# Patient Record
Sex: Male | Born: 1955 | Race: White | Hispanic: No | Marital: Single | Smoking: Never smoker
Health system: Southern US, Community
[De-identification: ages and names within clinical notes are randomized; demographics above are authoritative.]

## PROBLEM LIST (undated history)

## (undated) DIAGNOSIS — L509 Urticaria, unspecified: Secondary | ICD-10-CM

## (undated) HISTORY — PX: NO PAST SURGERIES: SHX2092

## (undated) HISTORY — DX: Urticaria, unspecified: L50.9

---

## 2000-03-11 ENCOUNTER — Encounter: Payer: Self-pay | Admitting: Family Medicine

## 2000-03-11 ENCOUNTER — Ambulatory Visit (HOSPITAL_COMMUNITY): Admission: RE | Admit: 2000-03-11 | Discharge: 2000-03-11 | Payer: Self-pay | Admitting: Family Medicine

## 2002-04-08 ENCOUNTER — Observation Stay (HOSPITAL_COMMUNITY): Admission: EM | Admit: 2002-04-08 | Discharge: 2002-04-09 | Payer: Self-pay | Admitting: Emergency Medicine

## 2002-04-08 ENCOUNTER — Encounter: Payer: Self-pay | Admitting: Emergency Medicine

## 2007-12-21 ENCOUNTER — Emergency Department (HOSPITAL_COMMUNITY): Admission: EM | Admit: 2007-12-21 | Discharge: 2007-12-21 | Payer: Self-pay | Admitting: Emergency Medicine

## 2007-12-29 ENCOUNTER — Ambulatory Visit (HOSPITAL_COMMUNITY): Admission: RE | Admit: 2007-12-29 | Discharge: 2007-12-29 | Payer: Self-pay | Admitting: Urology

## 2007-12-30 ENCOUNTER — Emergency Department (HOSPITAL_COMMUNITY): Admission: EM | Admit: 2007-12-30 | Discharge: 2007-12-30 | Payer: Self-pay | Admitting: Emergency Medicine

## 2009-05-26 IMAGING — CR DG ABDOMEN 1V
2 series · 2 of 2 positions shown · non-contrast
Comparison: None

CLINICAL DATA: Preop for right ureteral stone/right flank pain

ABDOMEN - 1 VIEW

[view not recorded (1 of 2)]
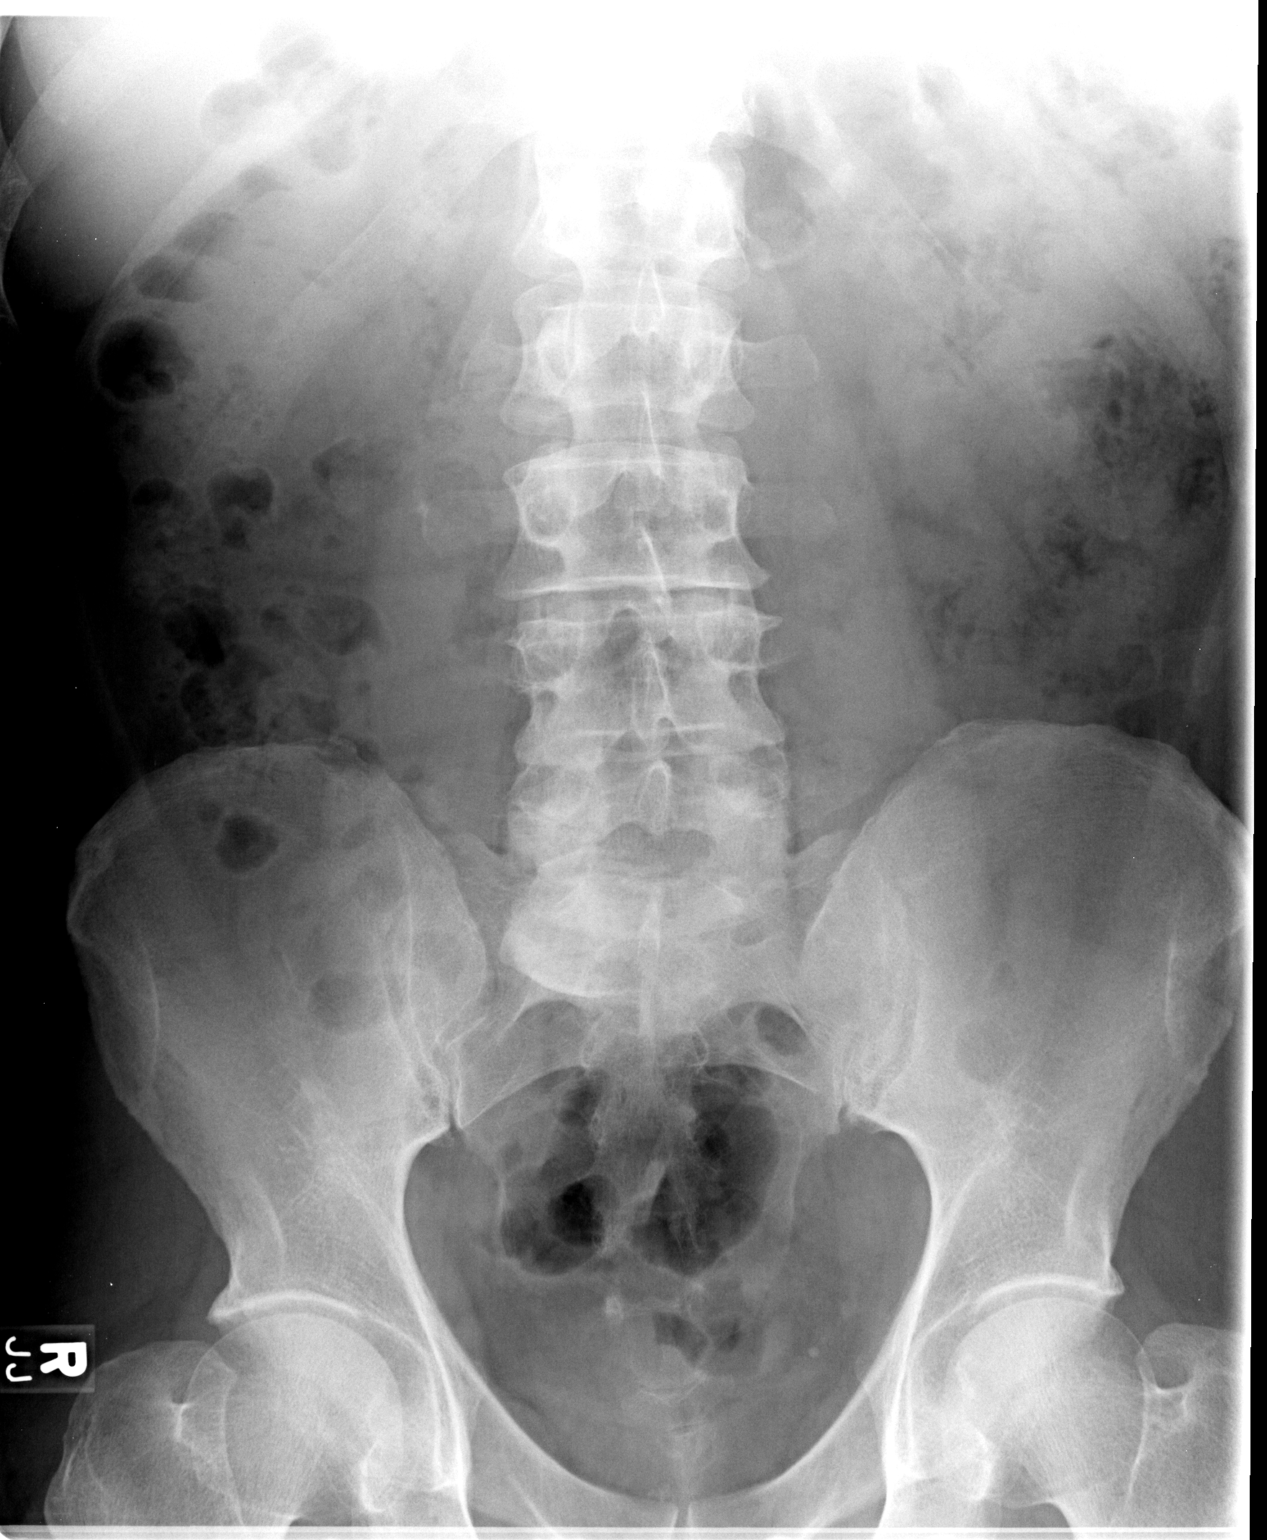

[view not recorded (2 of 2)]
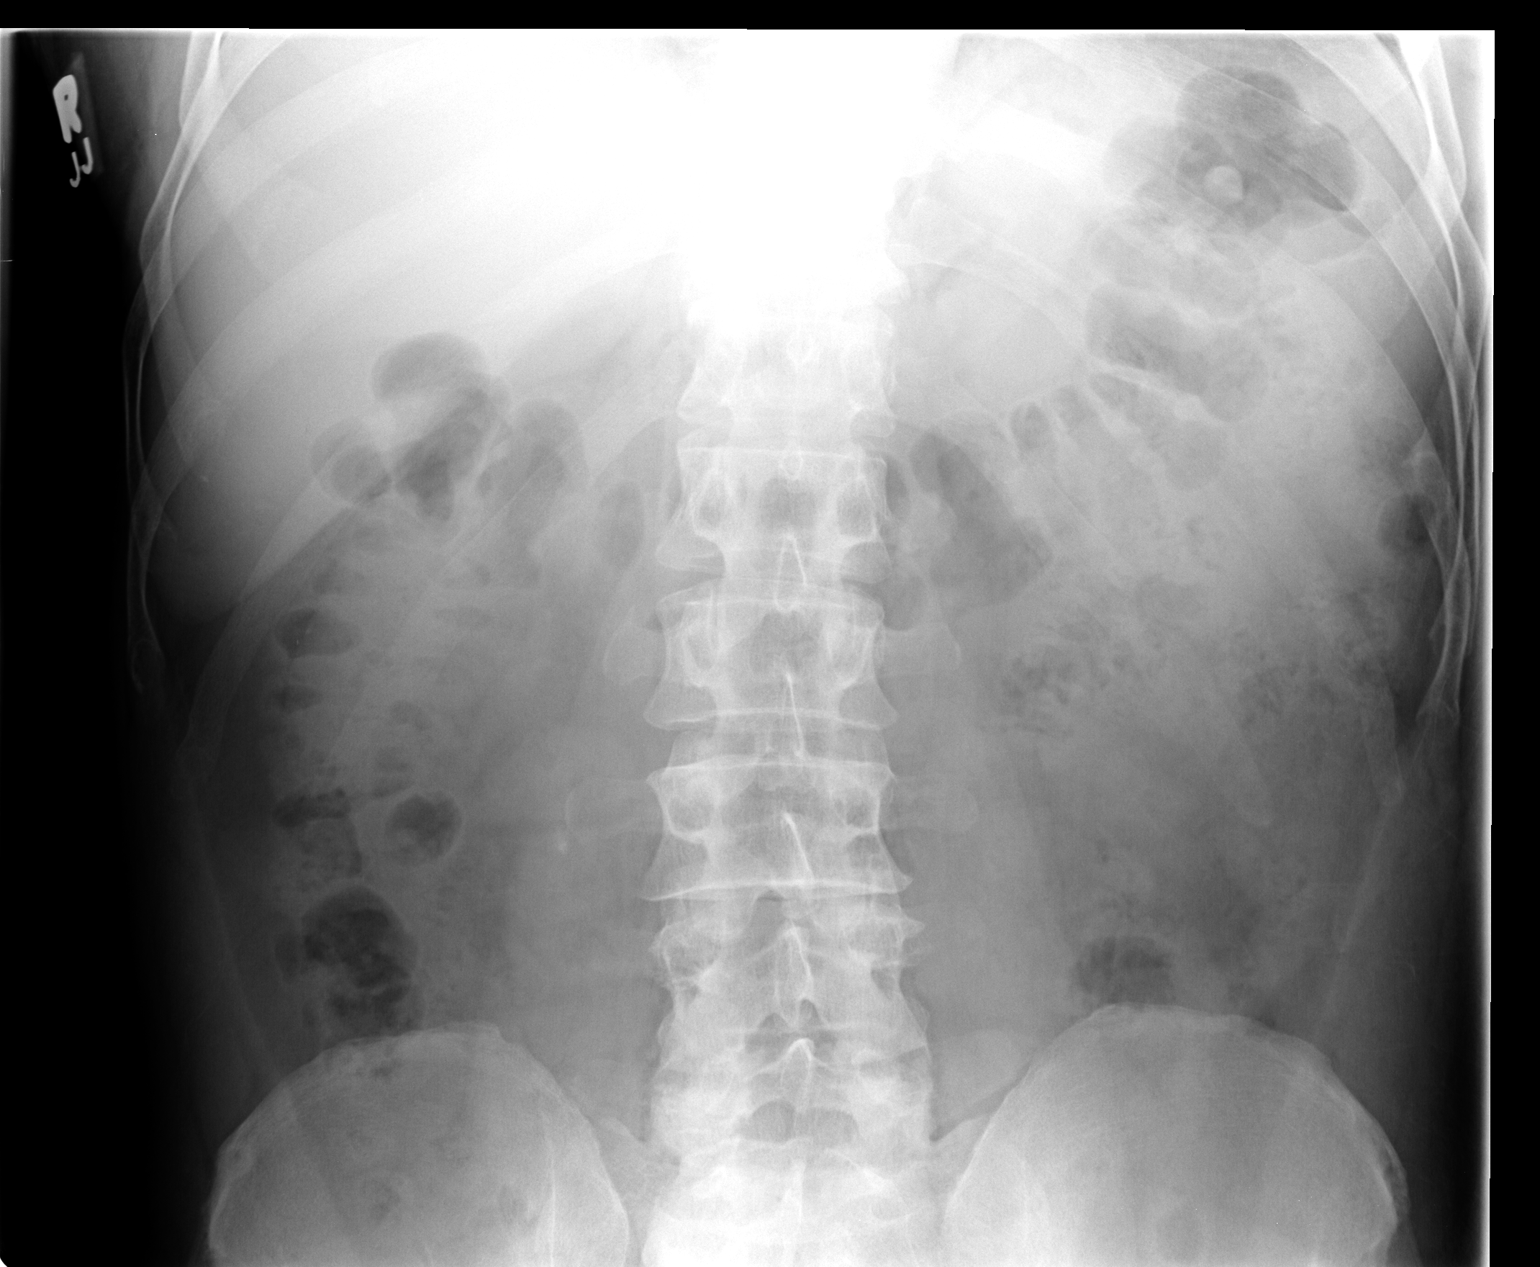

[2 of 2 positions shown; findings below may reference images not displayed]

FINDINGS: There is a 3.5 x 3.4 mm calcification projecting near the
right transverse process of L3, consistent with a ureteral stent.
No obvious renal calculi.  There are several phleboliths in the
pelvis.
IMPRESSION: Findings consistent with a 3.5 mm stone in the right upper/mid
ureter.

## 2011-01-19 NOTE — Discharge Summary (Signed)
NAME:  Ruben Day, Ruben Day                        ACCOUNT NO.:  [id number]   MEDICAL RECORD NO.:  [id number]                   PATIENT TYPE:  INP   LOCATION:  2025                                 FACILITY:  [facility]   PHYSICIAN:  Abelino Derrick, P.A.                DATE OF BIRTH:  Aug 21, 1956   DATE OF ADMISSION:  04/08/2002  DATE OF DISCHARGE:  04/09/2002                                 DISCHARGE SUMMARY   DISCHARGE DIAGNOSIS:  Chest pain.  No coronary disease by catheterization  this admission.   HISTORY OF PRESENT ILLNESS:  The patient is a 55 year old male followed by  Juluis Mire, M.D., who presented to the emergency department as a  referral from Dr. Tiburcio Pea on April 08, 2002.  He has a history of two months  of chest discomfort which was consistent with angina.  The symptoms were  gradually increasing in intensity and frequency.  He does have a family  history of coronary disease.  His brother had an MI in his 30s.   HOSPITAL COURSE:  He was admitted to telemetry, started on heparin and  aspirin, and up for diagnostic catheterization.  Enzymes were negative.  The  catheterization was done on April 09, 2002, by Richard A. Alanda Amass, M.D.,  which revealed normal RCA, normal circumflex, normal left main, and a 40%  narrowing in the second diagonal.  He had good LV function.  He tolerated  the procedure well.  The lipid profile was pending at discharge.   DISPOSITION:  The patient was discharged in stable condition late on April 09, 2002.   DISCHARGE MEDICATIONS:  1. Toprol XL 25 mg a day.  2. Nexium 40 mg a day.  3. Coated aspirin once a day.   LABORATORY DATA:  Sodium 140, potassium 3.7, BUN 19, creatinine 0.8.  CK-MBs  and troponins were negative.  The INR was 0.8.  White count 7.7, hemoglobin  15.0, hematocrit 44.8, platelet count 328.  Liver functions are normal.  The  TSH is within normal limits at 2.75.   The chest x-ray shows no active disease.  The EKG reveals  sinus rhythm  without acute changes.   CONDITION ON DISCHARGE:  The patient is discharged in stable condition.   FOLLOW-UP:  Will follow up in our office on April 20, 2002, for a groin  check and a follow-up lipid profile.  He does have a family history of early  coronary disease and had some mild coronary disease at the time of his  catheterization.  Aggressive risk management is indicated.  If his LDL is  greater than 100, this should be treated.  Long-term follow-up will need to  be determined later.  It is probably not appropriate that he go to  [place], [place], to see Sherral Hammers, M.D., since he lives in  [place], [place].  Abelino Derrick, P.Memory Dance  D:  04/09/2002  T:  04/14/2002  Job:  [id number]   cc:   Juluis Mire, M.D.

## 2011-01-19 NOTE — Cardiovascular Report (Signed)
NAME:  Ruben Day, Ruben Day                        ACCOUNT NO.:  [id number]   MEDICAL RECORD NO.:  [id number]                   PATIENT TYPE:  INP   LOCATION:  2025                                 FACILITY:  [facility]   PHYSICIAN:  Richard A. Alanda Amass, M.D.          DATE OF BIRTH:  1956/04/22   DATE OF PROCEDURE:  DATE OF DISCHARGE:  04/09/2002                              CARDIAC CATHETERIZATION   PROCEDURE:  Retrograde central aortic catheterization, selective coronary  angiography via Judkins technique, LV angiogram RAO and LAO projection,  abdominal aortic angiogram mid strength PA projection hand injection, single  stitch 6 French RFA Perclose device, successful.   DESCRIPTION OF PROCEDURE:  The patient was brought to the second floor CP  lab in a postabsorptive state after 5 mg of Valium p.o. premedication.  Heparin was on hold on entering the laboratory.  The right groin was  prepped, draped in the usual manner.  Xylocaine 1% was used for local  anesthesia.  The RFA was entered with a single anterior puncture using an 18  thin wall needle and a 6 French short DAG sidearm sheath were inserted  without difficulty.  Catheterization was performed with a 6 French 4 cm  taper preformed coronary and pigtail catheters using Omnipaque dye for  operative procedure.  LV angiogram was done in the RAO and LAO projection,  25 cc, 14 cc per second, 20 cc, 12 cc per second, respectively.  Pullback  pressure PA was performed, and showed no gradient across the aortic valve.  Abdominal angiogram was done in the midstream PA projection above the level  of the renal arteries by hand injection.  This demonstrated single normal  renal arteries bilaterally, and normal infrarenal abdominal aorta with good  proximal iliac runoff.  Catheter was removed.  Sidearm sheath was flushed.  Right femoral angiogram was done by hand injection through the sidearm  sheath in the shallow RAO projection demonstrating good  puncture into the  RCFA.   The right femoral arteriotomy was then closed with a single stitch old  Perclose device successfully.  Light hemostatic pressure was held on the  groin.  The patient was transferred to the holding area for postoperative  care in stable condition.   PRESENTING PROBLEM:  1. LV:  125/0; LVEDP 16/18 mmHg.  2. CA:  125/75 mmHg.  3. There was no gradient across the aortic valve on catheter pullback.   Fluoroscopy did not reveal any coronary, intracardiac, or valvular  calcification.   The main left coronary artery is normal.   The left anterior descending artery was widely patent and smooth throughout  its course.  It was normal and coursed the apex of the heart where it  bifurcated, it was a moderately large vessel.   The first diagonal branch arose from the very proximal left anterior  descending before SP1, and it was normal.   The second diagonal branch had approximately 40% or  less smooth narrowing  near its ostia on multiple projections with normal flow to a moderate size  diagonal that bifurcated.  It was not clear whether this was atherosclerotic  lesion or not, but it appeared to be mild with normal flow.   The third diagonal arose from the mid left anterior descending, was small  and normal.   The circumflex was a  non-dominant moderate sized vessel, comprised of a  large first marginal branch that was normal and trifurcated.  The circumflex  proper was normal and moderate size, and the LA branch was normal.   The right coronary artery was a dominant vessel and was widely patent,  smooth throughout its course.  There were two posterior descending artery  branches and a PLA branch that were normal, and several RV branches were  normal.  There was no coronary spasm present during the procedure, and no  arrhythmias.   LV angiogram demonstrated a normally contracting ventricle with sinus beats  with ejection fraction approximately 50 to 55%, no  mitral regurgitation.   This very pleasant 55 year old married father of three is a nonsmoker.  He  runs a chicken and beef farm, and has worked two jobs in the past.  He was  admitted to the hospital on referral to the ER by Dr. Tiburcio Pea for two months  of intermittent chest pain, increasing over the last several days.  It was  characterized by substernal aching with radiation to the left shoulder and  associated palpitations.  He also had some dizziness and fatigue associated  with this.  He does have a prior history of nephrolithiasis,  gastroesophageal reflux disease, hiatal hernia.  A brother had a myocardial  infarction in his 63s, and he is a nonsmoker.   Fortunately, essentially he has normal coronary arteries and left ventricle.  There is an area of approximately 40% narrowing of the ostium of the second  diagonal.  It is difficult to tell if this is atherosclerotic or not, but  there is good flow.   The etiology of his chest pain may be related to upper GI disease,  esophageal type, and I would recommend empiric therapy at this time, and  consideration for esophagogastroduodenoscopy should his symptoms persist.  With his palpitations, he might benefit at present from low-dose beta  blocker.  Lipid profile is pending.  The patient will be reassured about his  coronary status.  He will be followed up post-hospitalization by Dr.  Domingo Sep, his primary medical physician.   CATHETERIZATION DIAGNOSES:  1. Chest pain, etiology undetermined.  2. History of hiatal hernia.  3. Possible esophageal spasm and/or reflux accounting for chest pain.  4. Essentially normal coronary arteries and left ventricle.  5. Prior history of nephrolithiasis.  6. History of gastroesophageal reflux disease and hiatal hernia.  7. Nonsmoker.                                               Richard A. Alanda Amass, M.D.   RAW/MEDQ  D:  04/09/2002  T:  04/14/2002  Job:  [id number]   cc:   CP Lab   Sherral Hammers, M.D.   Juluis Mire, M.D.

## 2011-05-29 LAB — URINE MICROSCOPIC-ADD ON

## 2011-05-29 LAB — BASIC METABOLIC PANEL
BUN: 14
Calcium: 9.4
GFR calc non Af Amer: >60
Glucose, Bld: 94
Potassium: 3.9

## 2011-05-29 LAB — URINALYSIS, ROUTINE W REFLEX MICROSCOPIC
Bilirubin Urine: NEGATIVE
Glucose, UA: NEGATIVE
Glucose, UA: NEGATIVE
Hgb urine dipstick: NEGATIVE
Ketones, ur: NEGATIVE
Ketones, ur: NEGATIVE
Leukocytes, UA: NEGATIVE
Nitrite: NEGATIVE
Nitrite: NEGATIVE
Protein, ur: 30 — AB
Protein, ur: NEGATIVE
Specific Gravity, Urine: 1.018
Specific Gravity, Urine: 1.035 — ABNORMAL HIGH
Urobilinogen, UA: 0.2
Urobilinogen, UA: 0.2
pH: 6
pH: 8

## 2011-05-29 LAB — CBC
HCT: 41.3
Platelets: 320
RDW: 13.6

## 2014-01-22 ENCOUNTER — Other Ambulatory Visit: Payer: Self-pay | Admitting: Podiatrist

## 2014-04-04 ENCOUNTER — Other Ambulatory Visit: Payer: Self-pay | Admitting: Podiatrist

## 2017-03-11 ENCOUNTER — Encounter: Payer: Self-pay | Admitting: Allergy and Immunology

## 2017-03-11 ENCOUNTER — Ambulatory Visit (INDEPENDENT_AMBULATORY_CARE_PROVIDER_SITE_OTHER): Payer: 59 | Admitting: Allergy and Immunology

## 2017-03-11 VITALS — BP 136/72 | HR 78 | Temp 98.6°F | Resp 16 | Ht 67.0 in | Wt 187.0 lb

## 2017-03-11 DIAGNOSIS — I776 Arteritis, unspecified: Secondary | ICD-10-CM

## 2017-03-11 MED ORDER — METHYLPREDNISOLONE ACETATE 80 MG/ML IJ SUSP
80.0000 mg | Freq: Once | INTRAMUSCULAR | Status: AC
  Administered 2017-03-11: 80 mg via INTRAMUSCULAR

## 2017-03-11 NOTE — Progress Notes (Signed)
Dear Dr. Sol Passer,  Thank you for referring Tammy Sours to the [facility] [place] on 03/11/2017.   Below is a summation of this patient's evaluation and recommendations.  Thank you for your referral. I will keep you informed about this patient's response to treatment.   If you have any questions please do not hesitate to contact me.   Sincerely,  Jessica Priest, MD Allergy / Immunology [name]   ______________________________________________________________________    NEW PATIENT NOTE  Referring Provider: Olive Bass, MD Primary Provider: Olive Bass, MD Date of office visit: 03/11/2017    Subjective:   Chief Complaint:  Ruben Day (DOB: 19-Nov-1955) is a 61 y.o. male who presents to the clinic on 03/11/2017 with a chief complaint of Rash and Insect Bite .     HPI: Gene presents to this clinic in evaluation of a reaction that developed over the course of the past 3 weeks. Approximately 3 weeks ago he noted an attached tick on his left forearm which he removed only to have it swell up and turn red over the next few days for which he was treated with a sulfa-based antibiotic but subsequently developed fever and chills for which he went to an urgent care center and was given doxycycline. He once again developed a fever and chills and then developed "small knots" on his arms that progressed requiring him to go to the emergency room this past Sunday and received various topical agents.  He does not believe that he has any other associated symptoms. He no longer has any fever or chills or fatigue or myalgia or arthralgia or other skin abnormalities or stomach abnormalities. He overall feels very good other than the fact that he has these skin lesions which are not really itchy but may be slightly painful. He still continues to have new lesions that develop as he has a new one affecting  his left elbow and his right inferior periorbital region that developed over the course the past 24 hours. There is no obvious provoking factor giving rise to this issue as he has not really had an environmental change or new medication or any over-the-counter medications except as described above.  All blood tests that have been performed in the past including blood tests at the urgent care center and [facility] and the emergency room in [facility] have apparently been normal.  Past Medical History:  Diagnosis Date  . Urticaria     Past Surgical History:  Procedure Laterality Date  . NO PAST SURGERIES      Allergies as of 03/11/2017      Reactions   Atorvastatin Other (See Comments)   Simvastatin Other (See Comments)   Statins    myalgia   Venlafaxine Other (See Comments)   Doxycycline Rash      Medication List      aspirin EC 81 MG tablet Take once daily to help prevent heart attack   CIALIS 5 MG tablet Generic drug:  tadalafil Take 5 mg by mouth daily.   DiphenhydrAMINE HCl (Sleep) 50 MG tablet Take 50 mg by mouth.   esomeprazole 40 MG capsule Commonly known as:  NEXIUM Take 40 mg by mouth.   ezetimibe 10 MG tablet Commonly known as:  ZETIA   gabapentin 300 MG capsule Commonly known as:  NEURONTIN   hydrocortisone 2.5 % cream   ibuprofen 600 MG tablet  Commonly known as:  ADVIL,MOTRIN   mometasone 0.1 % cream Commonly known as:  ELOCON Apply topically.   mupirocin ointment 2 % Commonly known as:  BACTROBAN APPLY TOPICALLY 3 TIMES DAILY FOR 7 DAYS.   PHOSPHA 250 NEUTRAL [id number] MG Tabs   testosterone cypionate 100 MG/ML injection Commonly known as:  DEPOTESTOTERONE CYPIONATE INJECT 0.5ML INTO THE MUSCLE EVERY WEEK       Review of systems negative except as noted in HPI / PMHx or noted below:  Review of Systems  Constitutional: Negative.   HENT: Negative.   Eyes: Negative.   Respiratory: Negative.   Cardiovascular: Negative.     Gastrointestinal: Negative.   Genitourinary: Negative.   Musculoskeletal: Negative.   Skin: Negative.   Neurological: Negative.   Endo/Heme/Allergies: Negative.   Psychiatric/Behavioral: Negative.     Family History  Problem Relation Age of Onset  . Liver cancer Mother   . Alzheimer's disease Father     Social History   Social History  . Marital status: Single    Spouse name: N/A  . Number of children: N/A  . Years of education: N/A   Occupational History  . Not on file.   Social History Main Topics  . Smoking status: Never Smoker  . Smokeless tobacco: Never Used  . Alcohol use 8.4 oz/week    14 Glasses of wine per week  . Drug use: No  . Sexual activity: Not on file   Other Topics Concern  . Not on file   Social History Narrative  . 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  Objective:   Vitals:   03/11/17 1331  BP: 136/72  Pulse: 78  Resp: 16  Temp: 98.6 F (37 C)   Height: 5\' 7"  (170.2 cm) Weight: 187 lb (84.8 kg)  Physical Exam  Constitutional: He is well-developed, well-nourished, and in no distress.  HENT:  Head: Normocephalic. Head is without right periorbital erythema and without left periorbital erythema.  Right Ear: Tympanic membrane, external ear and ear canal normal.  Left Ear: Tympanic membrane, external ear and ear canal normal.  Nose: Nose normal. No mucosal edema or rhinorrhea.  Mouth/Throat: Uvula is midline, oropharynx is clear and moist and mucous membranes are normal. No oropharyngeal exudate.  Eyes: Conjunctivae and lids are normal. Pupils are equal, round, and reactive to light.  Neck: Trachea normal. No tracheal tenderness present. No tracheal deviation present. No thyromegaly  present.  Cardiovascular: Normal rate, regular rhythm, S1 normal, S2 normal and normal heart sounds.   No murmur heard. Pulmonary/Chest: Effort normal and breath sounds normal. No stridor. No tachypnea. No respiratory distress. He has no wheezes. He has no rales. He exhibits no tenderness.  Abdominal: Soft. He exhibits no distension and no mass. There is no hepatosplenomegaly. There is no tenderness. There is no rebound and no guarding.  Musculoskeletal: He exhibits no edema or tenderness.  Lymphadenopathy:       Head (right side): No tonsillar adenopathy present.       Head (left side): No tonsillar adenopathy present.    He has no cervical adenopathy.    He has no axillary adenopathy.  Neurological: He is alert. Gait normal.  Skin: No rash (Multiple indurated slightly erythematous violaceous nontender  lesions involving hands, forearm, elbow, shoulder, and a singular lesion involving right inferomedial periorbital region) noted. He is not diaphoretic. No erythema. No pallor. Nails show no clubbing.  Psychiatric: Mood and affect normal.    Diagnostics: Results of blood tests obtained 03/01/2017 at [facility] emergency room refers to normal renal function with a creatinine of 1.0, AST 88, ALT 62, white blood cell count 5.3 with normal differential, hemoglobin 18.4, platelet 242. He had a negative Lyme disease IgM antibody screen and rocket mounted spotted fever IgM screen was negative at 0.22 index.  Results of blood tests obtained 03/10/2017 identify creatinine of 0.80, white blood cell count 9.7 with normal differential, hemoglobin 14.1, platelet 351, sedimentation rate 40, urinalysis with out blood or protein although there was 2-5 red blood cells per high-power field.  Assessment and Plan:    1. Vasculitis (HCC)     1. Depo-Medrol 80 IM delivered in clinic today  2. Prednisone 10 mg - 2 tablets now, 2 tablets tonight, then two tablet twice a day for the next 10 days  3. Review all  previous blood test results. Further evaluation?  4. Return Wednesday afternoon  5. Contact clinic if problems during the interval  It does appear as though Carney Bern has developed a significant hypersensitivity reaction from his immunological overactivity with unknown etiologic factor. I'm going to treat him with anti-inflammatory agents as noted above and assume that this process will burn out over the course of the next several weeks. Of course, if he progresses in the face of this therapy he is going to require further evaluation for multiorgan involvement and is well we need to consider the possibility that some of his reactivity is driven by an infectious disease that is still active. I am going to see him back in this clinic in the next 48 hours for a reassessment of his response to therapy.  Jessica Priest, MD [facility]

## 2017-03-11 NOTE — Patient Instructions (Addendum)
  1. Depo-Medrol 80 IM delivered in clinic today  2. Prednisone 10 mg - 2 tablets now, 2 tablets tonight, then two tablet twice a day for the next 10 days  3. Review all previous blood test results. Further evaluation?  4. Return Wednesday afternoon  5. Contact clinic if problems during the interval

## 2017-03-13 ENCOUNTER — Ambulatory Visit: Payer: 59 | Admitting: Allergy and Immunology

## 2017-03-13 DIAGNOSIS — I776 Arteritis, unspecified: Secondary | ICD-10-CM

## 2017-03-13 NOTE — Progress Notes (Signed)
Ruben Day presents to this clinic to have reevaluation of what appeared to be a vasculitic reaction involving his upper extremities most likely secondary to a side effect from his medications or the result of some form of viral-induced infectious disease. He is much better at this point in time. He has not noticed any new lesions. He's had about a 75% reduction in his lesions although there is still significant hyperpigmentation across the lesions on his arms and hands. Overall he appears to be improving significantly and he will continue on his plan of anti-inflammatory agents including systemic steroids and we will see him back in this clinic pending his response. He has a full 10 days of therapy to utilize and at the end of that 10 day course of prednisone we will discontinue any additional anti-inflammatory medications and observe what happens with removal of that treatment. If he has recurrent recrudescence of this issue then he will require further evaluation in an attempt to identify the specific etiologic agent responsible for this issue and of course he will require additional therapy using anti-inflammatory medications.

## 2017-03-18 ENCOUNTER — Encounter: Payer: Self-pay | Admitting: Allergy and Immunology

## 2017-03-25 ENCOUNTER — Encounter: Payer: Self-pay | Admitting: *Deleted

## 2017-03-27 ENCOUNTER — Encounter: Payer: Self-pay | Admitting: Allergy and Immunology

## 2017-03-27 ENCOUNTER — Ambulatory Visit (INDEPENDENT_AMBULATORY_CARE_PROVIDER_SITE_OTHER): Payer: 59 | Admitting: Allergy and Immunology

## 2017-03-27 VITALS — BP 130/80 | HR 80 | Resp 16

## 2017-03-27 DIAGNOSIS — I776 Arteritis, unspecified: Secondary | ICD-10-CM | POA: Diagnosis not present

## 2017-03-27 MED ORDER — HALOBETASOL PROPIONATE 0.05 % EX OINT: TOPICAL_OINTMENT | CUTANEOUS | 3 refills | Status: DC

## 2017-03-27 MED ORDER — BETAMETHASONE DIPROPIONATE 0.05 % EX OINT: TOPICAL_OINTMENT | Freq: Two times a day (BID) | CUTANEOUS | 1 refills | Status: DC

## 2017-03-27 NOTE — Patient Instructions (Addendum)
  1. Prednisone 10 mg one tablet one time per day  2. Ultravate ointment to skin lesions 1-2 times per day  3. Blood - CBC w/diff, CMP, ANA w/reflex, ANCA w/reflex, antimitochondrial antibody, anti-smooth muscle (actin) antibody, LKM antibody, C3, C4,  4.  Urine - UA  5.  Return to clinic in 2 weeks or earlier if problem

## 2017-03-27 NOTE — Progress Notes (Signed)
Follow-up Note  Referring Provider: Olive Bassough, Robert L, MD Primary Provider: Olive Bassough, Robert L, MD Date of Office Visit: 03/27/2017  Subjective:   Ruben Day (DOB: [birth year]) is a 61 y.o. male who [facility]2017 in re-evaluation of the following:  HPI: Ruben Day presents to this clinic in reevaluation of his apparent vasculitis. I last saw him in his clinic on 03/13/2017 at which point in time he was doing really very well with what appeared to be a vasculitic reaction involving the skin on his hands and arms and face for which he was treated with systemic steroids with good results and almost complete resolution of his issue. He never developed any associated systemic or constitutional symptoms other than his initial presentation back in late June in which he developed fever and chills that was treated with doxycycline.  Over the course of the past 24 hours he has noticed that he has starting to redevelop the same dermatitis on his hands. Once again there is no associated systemic or constitutional symptoms.  Allergies as of 03/27/2017      Reactions   Atorvastatin Other (See Comments)   Simvastatin Other (See Comments)   Statins    myalgia   Venlafaxine Other (See Comments)   Doxycycline Rash      Medication List      aspirin EC 81 MG tablet Take once daily to help prevent heart attack   CIALIS 5 MG tablet Generic drug:  tadalafil Take 5 mg by mouth daily.   DiphenhydrAMINE HCl (Sleep) 50 MG tablet Take 50 mg by mouth.   esomeprazole 40 MG capsule Commonly known as:  NEXIUM Take 40 mg by mouth.   ezetimibe 10 MG tablet Commonly known as:  ZETIA   gabapentin 300 MG capsule Commonly known as:  NEURONTIN   ibuprofen 600 MG tablet Commonly known as:  ADVIL,MOTRIN   PHOSPHA 250 NEUTRAL 155-852-130 MG Tabs   testosterone cypionate 100 MG/ML injection Commonly known as:  DEPOTESTOTERONE CYPIONATE INJECT 0.5ML INTO THE MUSCLE  EVERY WEEK       Past Medical History:  Diagnosis Date  . Urticaria     Past Surgical History:  Procedure Laterality Date  . NO PAST SURGERIES      Review of systems negative except as noted in HPI / PMHx or noted below:  Review of Systems  Constitutional: Negative.   HENT: Negative.   Eyes: Negative.   Respiratory: Negative.   Cardiovascular: Negative.   Gastrointestinal: Negative.   Genitourinary: Negative.   Musculoskeletal: Negative.   Skin: Negative.   Neurological: Negative.   Endo/Heme/Allergies: Negative.   Psychiatric/Behavioral: Negative.      Objective:   Vitals:   03/27/17 1403  BP: 130/80  Pulse: 80  Resp: 16          Physical Exam  Constitutional: He is well-developed, well-nourished, and in no distress.  HENT:  Head: Normocephalic.  Right Ear: Tympanic membrane, external ear and ear canal normal.  Left Ear: Tympanic membrane, external ear and ear canal normal.  Nose: Nose normal. No mucosal edema or rhinorrhea.  Mouth/Throat: Uvula is midline, oropharynx is clear and moist and mucous membranes are normal. No oropharyngeal exudate.  Eyes: Conjunctivae are normal.  Neck: Trachea normal. No tracheal tenderness present. No tracheal deviation present. No thyromegaly present.  Cardiovascular: Normal rate, regular rhythm, S1 normal, S2 normal and normal heart sounds.   No murmur heard. Pulmonary/Chest: Breath sounds normal. No stridor.  No respiratory distress. He has no wheezes. He has no rales.  Musculoskeletal: He exhibits no edema.  Lymphadenopathy:       Head (right side): No tonsillar adenopathy present.       Head (left side): No tonsillar adenopathy present.    He has no cervical adenopathy.  Neurological: He is alert. Gait normal.  Skin: Rash (indurated nodular-like lesions with overriding scale affecting fingers and wrists extending into forearm.) noted. He is not diaphoretic. No erythema. Nails show no clubbing.  Psychiatric: Mood and  affect normal.    Diagnostics: none   Assessment and Plan:   1. Vasculitis (HCC)     1. Prednisone 10 mg one tablet one time per day  2. Ultravate ointment to skin lesions 1-2 times per day  3. Blood - CBC w/diff, CMP, ANA w/reflex, ANCA w/reflex, antimitochondrial antibody, anti-smooth muscle (actin) antibody, LKM antibody, C3, C4, viral hepatitis screen  4.  Urine - UA  5.  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 I will see him back in this clinic in 2 weeks or earlier if there is a problem. I am hopeful that this whole process will burn out and everything was the result of his febrile illness that presented in June.[name].  Marlo Goodrich, MD Allergy / Immunology [facility]

## 2017-03-29 LAB — URINALYSIS
Bilirubin, UA: NEGATIVE
Glucose, UA: NEGATIVE
LEUKOCYTES UA: NEGATIVE
NITRITE UA: NEGATIVE
Protein, UA: NEGATIVE
RBC, UA: NEGATIVE
SPEC GRAV UA: 1.023 (ref 1.005–1.030)
Urobilinogen, Ur: 0.2 mg/dL (ref 0.2–1.0)
pH, UA: 5 (ref 5.0–7.5)

## 2017-03-29 LAB — COMPREHENSIVE METABOLIC PANEL
ALT: 28 IU/L (ref 0–44)
AST: 25 IU/L (ref 0–40)
Albumin/Globulin Ratio: 1.7 (ref 1.2–2.2)
Albumin: 4 g/dL (ref 3.6–4.8)
Alkaline Phosphatase: 84 IU/L (ref 39–117)
BUN/Creatinine Ratio: 23 (ref 10–24)
BUN: 17 mg/dL (ref 8–27)
Bilirubin Total: 0.3 mg/dL (ref 0.0–1.2)
CALCIUM: 8.9 mg/dL (ref 8.6–10.2)
CO2: 23 mmol/L (ref 20–29)
CREATININE: 0.75 mg/dL — AB (ref 0.76–1.27)
Chloride: 103 mmol/L (ref 96–106)
GFR calc Af Amer: 113 mL/min/{1.73_m2} (ref >59)
GFR, EST NON AFRICAN AMERICAN: 98 mL/min/{1.73_m2} (ref >59)
Globulin, Total: 2.3 g/dL (ref 1.5–4.5)
Glucose: 97 mg/dL (ref 65–99)
Potassium: 4.6 mmol/L (ref 3.5–5.2)
Sodium: 141 mmol/L (ref 134–144)
Total Protein: 6.3 g/dL (ref 6.0–8.5)

## 2017-03-29 LAB — ANA W/REFLEX IF POSITIVE: ANA: NEGATIVE

## 2017-03-29 LAB — ANTI-SMOOTH MUSCLE ANTIBODY, IGG: SMOOTH MUSCLE AB: 20 U — AB (ref 0–19)

## 2017-03-29 LAB — CBC WITH DIFFERENTIAL/PLATELET
Basophils Absolute: 0 10*3/uL (ref 0.0–0.2)
Basos: 0 %
EOS (ABSOLUTE): 0.1 10*3/uL (ref 0.0–0.4)
EOS: 1 %
HEMATOCRIT: 43.6 % (ref 37.5–51.0)
Hemoglobin: 14.7 g/dL (ref 13.0–17.7)
IMMATURE GRANS (ABS): 0 10*3/uL (ref 0.0–0.1)
IMMATURE GRANULOCYTES: 0 %
LYMPHS: 27 %
Lymphocytes Absolute: 2.1 10*3/uL (ref 0.7–3.1)
MCH: 31.9 pg (ref 26.6–33.0)
MCHC: 33.7 g/dL (ref 31.5–35.7)
MCV: 95 fL (ref 79–97)
MONOS ABS: 0.6 10*3/uL (ref 0.1–0.9)
Monocytes: 8 %
Neutrophils Absolute: 4.9 10*3/uL (ref 1.4–7.0)
Neutrophils: 64 %
PLATELETS: 235 10*3/uL (ref 150–379)
RBC: 4.61 x10E6/uL (ref 4.14–5.80)
RDW: 13.1 % (ref 12.3–15.4)
WBC: 7.7 10*3/uL (ref 3.4–10.8)

## 2017-03-29 LAB — C3 AND C4
COMPLEMENT C4, SERUM: 37 mg/dL (ref 14–44)
Complement C3, Serum: 177 mg/dL — ABNORMAL HIGH (ref 82–167)

## 2017-03-29 LAB — PAN-ANCA
ANCA Proteinase 3: <3.5 U/mL (ref 0.0–3.5)
Atypical pANCA: <1:20 {titer}
C-ANCA: <1:20 {titer}
Myeloperoxidase Ab: <9 U/mL (ref 0.0–9.0)
P-ANCA: <1:20 {titer}

## 2017-03-29 LAB — MITOCHONDRIAL ANTIBODIES: MITOCHONDRIAL AB: 4 U (ref 0.0–20.0)

## 2017-03-29 LAB — ANTI-MICROSOMAL ANTIBODY LIVER / KIDNEY: LKM1 AB: 2.3 U (ref 0.0–20.0)

## 2017-04-01 ENCOUNTER — Telehealth: Payer: Self-pay

## 2017-04-01 NOTE — Telephone Encounter (Signed)
Pt states that Ultravate cream is keeping symptoms "at bay", however he is no worse but certainly no better.

## 2017-04-01 NOTE — Telephone Encounter (Signed)
Please make him a appointment at [facility] for this week or next week.

## 2017-04-01 NOTE — Telephone Encounter (Signed)
L/M for patient to contact office to advise Dr Lucie LeatherKozlow instructions and appt with Ebony CargoGay Markham at [facility] this Thursday Aug 2 at 8:45.  Will fax notes and labs.

## 2017-04-02 LAB — HEPATITIS B SURFACE ANTIBODY, QUANTITATIVE: Hepatitis B Surf Ab Quant: <3.1 m[IU]/mL — ABNORMAL LOW (ref >9.9)

## 2017-04-02 LAB — HEPATITIS C ANTIBODY: Hep C Virus Ab: <0.1 s/co ratio (ref 0.0–0.9)

## 2017-04-02 LAB — SPECIMEN STATUS REPORT

## 2018-11-05 DIAGNOSIS — I10 Essential (primary) hypertension: Secondary | ICD-10-CM

## 2018-11-05 DIAGNOSIS — R0781 Pleurodynia: Secondary | ICD-10-CM

## 2018-11-05 DIAGNOSIS — E785 Hyperlipidemia, unspecified: Secondary | ICD-10-CM

## 2023-07-31 ENCOUNTER — Ambulatory Visit (INDEPENDENT_AMBULATORY_CARE_PROVIDER_SITE_OTHER): Payer: Self-pay | Admitting: Allergy and Immunology

## 2023-07-31 ENCOUNTER — Encounter: Payer: Self-pay | Admitting: Allergy and Immunology

## 2023-07-31 VITALS — BP 118/76 | HR 61 | Resp 16 | Ht 67.0 in | Wt 171.4 lb

## 2023-07-31 DIAGNOSIS — T7840XD Allergy, unspecified, subsequent encounter: Secondary | ICD-10-CM

## 2023-07-31 DIAGNOSIS — B369 Superficial mycosis, unspecified: Secondary | ICD-10-CM | POA: Diagnosis not present

## 2023-07-31 DIAGNOSIS — T783XXD Angioneurotic edema, subsequent encounter: Secondary | ICD-10-CM

## 2023-07-31 MED ORDER — FLUCONAZOLE 150 MG PO TABS: ORAL_TABLET | ORAL | 0 refills | Status: DC

## 2023-07-31 MED ORDER — FAMOTIDINE 20 MG PO TABS: 20.0000 mg | ORAL_TABLET | Freq: Two times a day (BID) | ORAL | 5 refills | Status: AC

## 2023-07-31 MED ORDER — LORATADINE 10 MG PO TABS: 10.0000 mg | ORAL_TABLET | Freq: Two times a day (BID) | ORAL | 5 refills | Status: AC

## 2023-07-31 NOTE — Progress Notes (Signed)
[place] - [place] - [place] - [place] - [place]   Dear Dr. Sol Passer,  Thank you for referring Ruben Day to the [facility] [place] on 07/31/2023.   Below is a summation of this patient's evaluation and recommendations.  Thank you for your referral. I will keep you informed about this patient's response to treatment.   If you have any questions please do not hesitate to contact me.   Sincerely,  Jessica Priest, MD Allergy / Immunology [facility]   ______________________________________________________________________    NEW PATIENT NOTE  Referring Provider: Olive Bass, MD Primary Provider: Olive Bass, MD Date of office visit: 07/31/2023    Subjective:   Chief Complaint:  Ruben Day (DOB: [birth year]) is a 67 y.o. male who presents to the clinic on 07/31/2023 with a chief complaint of Oral Swelling (Lip swelling) .     HPI: Ruben Day presents to this clinic in evaluation of lip swelling.  For the past 3 months he has been having a "irritation" on his lips that make his lips feel puffy and red.  He has no other associated systemic or constitutional symptoms.  However, sometimes he notices these "small blisters" on the inner part of his lower lip.  There is not an obvious provoking factor giving rise to this issue.  He has not really had any new medications, supplements, health foods, vitamins, new hobbies, change in his environment, or symptoms of an ongoing infectious disease.  Treatment has included the administration of prednisone on 3 occasions and every time he gets prednisone he does respond and all of his lip swelling resolves.  He has also been using some mometasone cream on his lips which also appears to help this issue.  He has this mometasone cream prescribed for chronic otitis externa.  He does not really have any other atopic symptoms or diseases.  Past  Medical History:  Diagnosis Date   Urticaria     Past Surgical History:  Procedure Laterality Date   NO PAST SURGERIES      Allergies as of 07/31/2023       Reactions   Atorvastatin Other (See Comments)   Simvastatin Other (See Comments)   Statins    myalgia   Venlafaxine Other (See Comments)   Doxycycline Rash        Medication List    aspirin EC 81 MG tablet Take once daily to help prevent heart attack   carvedilol 12.5 MG tablet Commonly known as: COREG Take 1 tablet by mouth every 12 (twelve) hours.   esomeprazole 40 MG capsule Commonly known as: NEXIUM Take 40 mg by mouth.   gabapentin 300 MG capsule Commonly known as: NEURONTIN   hydrOXYzine 10 MG tablet Commonly known as: ATARAX Take 10 mg by mouth.   mometasone 0.1 % cream Commonly known as: ELOCON Apply 1 Application topically daily.    Review of systems negative except as noted in HPI / PMHx or noted below:  Review of Systems  Constitutional: Negative.   HENT: Negative.    Eyes: Negative.   Respiratory: Negative.    Cardiovascular: Negative.   Gastrointestinal: Negative.   Genitourinary: Negative.   Musculoskeletal: Negative.   Skin: Negative.   Neurological: Negative.   Endo/Heme/Allergies: Negative.   Psychiatric/Behavioral: Negative.      Family History  Problem Relation Age of Onset   Liver cancer Mother    Alzheimer's disease Father  Social History   Socioeconomic History   Marital status: Single    Spouse name: Not on file   Number of children: Not on file   Years of education: Not on file   Highest education level: Not on file  Occupational History   Not on file  Tobacco Use   Smoking status: Never   Smokeless tobacco: Never  Vaping Use   Vaping status: Never Used  Substance and Sexual Activity   Alcohol use: Yes    Alcohol/week: 14.0 standard drinks of alcohol    Types: 14 Glasses of wine per week   Drug use: No   Sexual activity: Not on file  Other  Topics Concern   Not on file  Social History Narrative   Not on file   Environmental and Social history  Lives in a house with a dry [place], dog located inside the household, no carpet in the bedroom, plastic on the bed, plastic on the pillow, no smoking or ongoing inside the household.  He is a Theme park manager.  Objective:   Vitals:   07/31/23 0950  BP: 118/76  Pulse: 61  Resp: 16  SpO2: 98%   Height: 5\' 7"  (170.2 cm) Weight: 171 lb 6.4 oz (77.7 kg)  Physical Exam Constitutional:      Appearance: He is not diaphoretic.  HENT:     Head: Normocephalic.     Right Ear: Tympanic membrane, ear canal and external ear normal.     Left Ear: Tympanic membrane, ear canal and external ear normal.     Nose: Nose normal. No mucosal edema or rhinorrhea.     Mouth/Throat:     Pharynx: Uvula midline. No oropharyngeal exudate.  Eyes:     Conjunctiva/sclera: Conjunctivae normal.  Neck:     Thyroid: No thyromegaly.     Trachea: Trachea normal. No tracheal tenderness or tracheal deviation.  Cardiovascular:     Rate and Rhythm: Normal rate and regular rhythm.     Heart sounds: Normal heart sounds, S1 normal and S2 normal. No murmur heard. Pulmonary:     Effort: No respiratory distress.     Breath sounds: Normal breath sounds. No stridor. No wheezing or rales.  Lymphadenopathy:     Head:     Right side of head: No tonsillar adenopathy.     Left side of head: No tonsillar adenopathy.     Cervical: No cervical adenopathy.  Skin:    Findings: No erythema or rash.     Nails: There is no clubbing.  Neurological:     Mental Status: He is alert.     Diagnostics: Allergy skin tests were not performed.   Assessment and Plan:    1. Hypersensitivity reaction, subsequent encounter   2. Angioedema, subsequent encounter   3. Fungal dermatitis    1. Treat and prevent immune activation:   A. Loratadine 10 mg - 1 tablet 2 times per day  B. Famotidine 20 mg - 1 tablet 2 times per day  C.  Continue mometasone 0.1% cream if needed  2. Treat fungal overgrowth:   A. Diflucan 150 - 1 tablet today, Saturday, Tuesday (3 pills)  3. Blood - CBC w/d, CMP, TSH, FT4, thyroid peroxidase ab  4. Further evaluation??? Yes, if not responding to treatment  5. Contact clinic in 1 month with update.  It is not very clear what is going on with Ruben Day and his lips but this appears to be an irritant reaction possibly from some low growth of  fungus and we are going to treat him with a collection of agents to lower his immunological hyperreactivity and also treat him with an antifungal agent and screen his blood for a worrisome systemic disease as contributing to this issue.  I will contact him with the results of these blood test once they are available for review.  Jessica Priest, MD Allergy / Immunology [facility] [place]

## 2023-07-31 NOTE — Patient Instructions (Addendum)
  1. Treat and prevent immune activation:   A. Loratadine 10 mg - 1 tablet 2 times per day  B. Famotidine 20 mg - 1 tablet 2 times per day  C. Continue mometasone 0.1% cream if needed  2. Treat fungal overgrowth:   A. Diflucan 150 - 1 tablet today, Saturday, Tuesday (3 pills)  3. Blood - CBC w/d, CMP, TSH, FT4, thyroid peroxidase ab  4. Further evaluation??? Yes, if not responding to treatment  5. Contact clinic in 1 month with update.

## 2023-08-01 LAB — CBC WITH DIFF/PLATELET
Basophils Absolute: 0 10*3/uL (ref 0.0–0.2)
Basos: 1 %
EOS (ABSOLUTE): 0.2 10*3/uL (ref 0.0–0.4)
Eos: 3 %
Hematocrit: 43.6 % (ref 37.5–51.0)
Hemoglobin: 14 g/dL (ref 13.0–17.7)
Immature Grans (Abs): 0 10*3/uL (ref 0.0–0.1)
Immature Granulocytes: 0 %
Lymphocytes Absolute: 2.2 10*3/uL (ref 0.7–3.1)
Lymphs: 37 %
MCH: 29.9 pg (ref 26.6–33.0)
MCHC: 32.1 g/dL (ref 31.5–35.7)
MCV: 93 fL (ref 79–97)
Monocytes Absolute: 0.6 10*3/uL (ref 0.1–0.9)
Monocytes: 9 %
Neutrophils Absolute: 3 10*3/uL (ref 1.4–7.0)
Neutrophils: 50 %
Platelets: 270 10*3/uL (ref 150–450)
RBC: 4.68 x10E6/uL (ref 4.14–5.80)
RDW: 12.5 % (ref 11.6–15.4)
WBC: 6 10*3/uL (ref 3.4–10.8)

## 2023-08-01 LAB — COMPREHENSIVE METABOLIC PANEL
ALT: 18 [IU]/L (ref 0–44)
AST: 25 [IU]/L (ref 0–40)
Albumin: 4.3 g/dL (ref 3.9–4.9)
Alkaline Phosphatase: 91 [IU]/L (ref 44–121)
BUN/Creatinine Ratio: 24 (ref 10–24)
BUN: 19 mg/dL (ref 8–27)
Bilirubin Total: 0.3 mg/dL (ref 0.0–1.2)
CO2: 25 mmol/L (ref 20–29)
Calcium: 9.6 mg/dL (ref 8.6–10.2)
Chloride: 104 mmol/L (ref 96–106)
Creatinine, Ser: 0.8 mg/dL (ref 0.76–1.27)
Globulin, Total: 2.3 g/dL (ref 1.5–4.5)
Glucose: 90 mg/dL (ref 70–99)
Potassium: 5.1 mmol/L (ref 3.5–5.2)
Sodium: 143 mmol/L (ref 134–144)
Total Protein: 6.6 g/dL (ref 6.0–8.5)
eGFR: 96 mL/min/{1.73_m2} (ref >59)

## 2023-08-01 LAB — THYROID PEROXIDASE ANTIBODY: Thyroperoxidase Ab SerPl-aCnc: 22 [IU]/mL (ref 0–34)

## 2023-08-01 LAB — TSH+FREE T4
Free T4: 1.14 ng/dL (ref 0.82–1.77)
TSH: 1.82 u[IU]/mL (ref 0.450–4.500)

## 2023-08-05 ENCOUNTER — Encounter: Payer: Self-pay | Admitting: Allergy and Immunology

## 2023-08-15 ENCOUNTER — Ambulatory Visit: Payer: 59 | Admitting: Allergy and Immunology

## 2023-09-02 ENCOUNTER — Telehealth: Payer: Self-pay | Admitting: Allergy and Immunology

## 2023-09-02 NOTE — Telephone Encounter (Signed)
Patient is scheduled for 1/2 at 2:30pm.

## 2023-09-02 NOTE — Telephone Encounter (Signed)
Patient states ever since he started the medications we prescribed his reactions seem to be getting worse. His lips are still swelling and he still has blisters inside his mouth that have not gone away.

## 2023-09-05 ENCOUNTER — Ambulatory Visit (INDEPENDENT_AMBULATORY_CARE_PROVIDER_SITE_OTHER): Payer: Medicare Other | Admitting: Allergy and Immunology

## 2023-09-05 ENCOUNTER — Encounter: Payer: Self-pay | Admitting: Allergy and Immunology

## 2023-09-05 VITALS — BP 128/74 | HR 60 | Resp 14

## 2023-09-05 DIAGNOSIS — T783XXD Angioneurotic edema, subsequent encounter: Secondary | ICD-10-CM | POA: Diagnosis not present

## 2023-09-05 DIAGNOSIS — T148XXA Other injury of unspecified body region, initial encounter: Secondary | ICD-10-CM

## 2023-09-05 DIAGNOSIS — T7840XD Allergy, unspecified, subsequent encounter: Secondary | ICD-10-CM | POA: Diagnosis not present

## 2023-09-05 MED ORDER — VALACYCLOVIR HCL 1 G PO TABS: ORAL_TABLET | ORAL | 1 refills | Status: DC

## 2023-09-05 NOTE — Patient Instructions (Addendum)
  1. Treat and prevent immune activation:   A. Loratadine  10 mg - 1 tablet 2 times per day (does this help?)  B. Famotidine  20 mg - 1 tablet 2 times per day (does this help?)  C. Continue mometasone 0.1% cream if needed (does this help?)  2. Start Valtrex  1000 mg - 1 tablet 1 time per day, every day  3. Blood - HSV 1/2 ab, anti-skin ab, ANA w/r, SED, CRP, C4  4. Return to clinic in 1 month or earlier if problem

## 2023-09-05 NOTE — Progress Notes (Signed)
 [place] - [place] - [place] - [place] - [place]   Follow-up Note  Referring Provider: Ofilia Lamar CROME, MD Primary Provider: Ofilia Lamar CROME, MD Date of Office Visit: 09/05/2023  Subjective:   Ruben Day (DOB: 22-Nov-1955) is a 68 y.o. male who [facility] on 09/05/2023 in re-evaluation of the following:  HPI: Ruben Day returns to this clinic in evaluation of hypersensitivity reaction involving lips and oral cavity.  I last saw him in this clinic during his initial evaluation 31 July 2023.  He does not think that he is not any better on the plan that we established during his initial visit.  He still has irritation of his lips and they sometimes feel puffy and they sometimes get small blisters.  He will put on mometasone cream a few times per week which appears to help this issue.  Once again he does not have any associated systemic or constitutional symptoms with this issue.  Allergies as of 09/05/2023       Reactions   Atorvastatin Other (See Comments)   Simvastatin Other (See Comments)   Statins    myalgia   Venlafaxine Other (See Comments)   Doxycycline Rash        Medication List    aspirin EC 81 MG tablet Take once daily to help prevent heart attack   carvedilol 12.5 MG tablet Commonly known as: COREG Take 1 tablet by mouth every 12 (twelve) hours.   esomeprazole 40 MG capsule Commonly known as: NEXIUM Take 40 mg by mouth.   famotidine  20 MG tablet Commonly known as: PEPCID  Take 1 tablet (20 mg total) by mouth 2 (two) times daily.   gabapentin 800 MG tablet Commonly known as: NEURONTIN Take 800 mg by mouth 3 (three) times daily.   hydrOXYzine 10 MG tablet Commonly known as: ATARAX Take 10 mg by mouth.   loratadine  10 MG tablet Commonly known as: Claritin  Take 1 tablet (10 mg total) by mouth 2 (two) times daily.   mometasone 0.1 % cream Commonly known as: ELOCON Apply 1 Application topically daily.     Past Medical History:  Diagnosis Date   Urticaria     Past Surgical History:  Procedure Laterality Date   NO PAST SURGERIES      Review of systems negative except as noted in HPI / PMHx or noted below:  Review of Systems  Constitutional: Negative.   HENT: Negative.    Eyes: Negative.   Respiratory: Negative.    Cardiovascular: Negative.   Gastrointestinal: Negative.   Genitourinary: Negative.   Musculoskeletal: Negative.   Skin: Negative.   Neurological: Negative.   Endo/Heme/Allergies: Negative.   Psychiatric/Behavioral: Negative.       Objective:   Vitals:   09/05/23 1427 09/05/23 1452  BP: (!) 144/76 128/74  Pulse: 60   Resp: 14   SpO2: 94%           Physical Exam HENT:     Mouth/Throat:     Tongue: No lesions.     Pharynx: No pharyngeal swelling, oropharyngeal exudate or posterior oropharyngeal erythema.     Diagnostics:   Results of blood tests obtained 31 July 2023 identifies creatinine 0.80 Mg/DL, AST 74L/O, ALT 81L/O, WBC 6.0, absolute eosinophil 200, absolute basophil 0, absolute lymphocyte 2200, hemoglobin 14.0, platelet 270, TSH 1.820 IU/mL, free T41.11 NG/DL, thyroid  peroxidase antibody 22U/ML,  Assessment and Plan:   1. Hypersensitivity reaction, subsequent encounter   2. Angioedema, subsequent encounter  3. Blistering    1. Treat and prevent immune activation:   A. Loratadine  10 mg - 1 tablet 2 times per day (does this help?)  B. Famotidine  20 mg - 1 tablet 2 times per day (does this help?)  C. Continue mometasone 0.1% cream if needed (does this help?)  2. Start Valtrex  1000 mg - 1 tablet 1 time per day, every day  3. Blood - HSV 1/2 ab, anti-skin ab, ANA w/r, SED, CRP, C4  4. 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  I am not really sure that his H1 and H2 receptor blockers helped him very much and he can make a determination about whether or not he needs to continue these.  Certainly topical mometasone appears to help somewhat.  I will contact him with the results of his blood test and I will see him back in this clinic in 1 month.  Camellia Denis, MD Allergy / Immunology [facility]

## 2023-09-06 ENCOUNTER — Telehealth: Payer: Self-pay | Admitting: Allergy and Immunology

## 2023-09-06 NOTE — Telephone Encounter (Signed)
 Patient states Valtrex is costing him $100. He would like to know if there is a cheaper alternative that can be sent in.

## 2023-09-07 LAB — HSV 1 AND 2 AB, IGG
HSV 1 Glycoprotein G Ab, IgG: REACTIVE — AB
HSV 2 IgG, Type Spec: NONREACTIVE

## 2023-09-07 LAB — ANA W/REFLEX

## 2023-09-07 LAB — ANTISKIN AUTOANTIBODIES, QUANT

## 2023-09-07 LAB — C-REACTIVE PROTEIN: CRP: 1 mg/L (ref 0–10)

## 2023-09-07 LAB — SEDIMENTATION RATE: Sed Rate: 4 mm/h (ref 0–30)

## 2023-09-09 ENCOUNTER — Encounter: Payer: Self-pay | Admitting: Allergy and Immunology

## 2023-09-09 NOTE — Telephone Encounter (Signed)
 Talked with Ruben Day at [facility] and price is $47.  Called patient and informed him.  He actually already picked it up over the weekend.

## 2023-09-13 LAB — C4 COMPLEMENT: Complement C4, Serum: 30 mg/dL (ref 12–38)

## 2023-09-13 LAB — SPECIMEN STATUS REPORT

## 2023-10-02 ENCOUNTER — Telehealth: Payer: Self-pay

## 2023-10-02 NOTE — Telephone Encounter (Signed)
Error

## 2023-10-03 ENCOUNTER — Ambulatory Visit (INDEPENDENT_AMBULATORY_CARE_PROVIDER_SITE_OTHER): Payer: Medicare Other | Admitting: Allergy and Immunology

## 2023-10-03 ENCOUNTER — Encounter: Payer: Self-pay | Admitting: Allergy and Immunology

## 2023-10-03 ENCOUNTER — Encounter: Payer: Self-pay | Admitting: *Deleted

## 2023-10-03 VITALS — BP 126/74 | HR 70 | Resp 16

## 2023-10-03 DIAGNOSIS — T148XXA Other injury of unspecified body region, initial encounter: Secondary | ICD-10-CM

## 2023-10-03 DIAGNOSIS — T783XXD Angioneurotic edema, subsequent encounter: Secondary | ICD-10-CM

## 2023-10-03 DIAGNOSIS — T7840XD Allergy, unspecified, subsequent encounter: Secondary | ICD-10-CM

## 2023-10-03 NOTE — Patient Instructions (Addendum)
  1. Continue mometasone 0.1% cream 3 times per week if needed  2. Visit with [facility] about lip / gum inflammation

## 2023-10-03 NOTE — Progress Notes (Signed)
[name] - [place] - [place] - [place] - [place]   Follow-up Note  Referring Provider: Olive Bass, MD Primary Provider: Olive Bass, MD Date of Office Visit: 10/03/2023  Subjective:   Ruben Day (DOB: 1955-12-25) is a 68 y.o. male who [facility] on 10/03/2023 in re-evaluation of the following:  HPI: Ruben Day returns to this clinic in evaluation of lip inflammation.  I last saw him in this clinic 05 September 2023.  He does not think that his lips and gums are any better while using Valtrex on a consistent basis.  In review, he has used an H1 and H2 receptor blocker, fluconazole, Valtrex, and topical mometasone on his lips.  The only thing that helps him is a topical mometasone on his lips and the use of systemic steroids.  He is using his topical agent about every 4 days.  Allergies as of 10/03/2023       Reactions   Atorvastatin Other (See Comments)   Simvastatin Other (See Comments)   Statins    myalgia   Venlafaxine Other (See Comments)   Doxycycline Rash        Medication List    aspirin EC 81 MG tablet Take once daily to help prevent heart attack   carvedilol 12.5 MG tablet Commonly known as: COREG Take 1 tablet by mouth every 12 (twelve) hours.   esomeprazole 40 MG capsule Commonly known as: NEXIUM Take 40 mg by mouth.   famotidine 20 MG tablet Commonly known as: PEPCID Take 1 tablet (20 mg total) by mouth 2 (two) times daily.   gabapentin 800 MG tablet Commonly known as: NEURONTIN Take 800 mg by mouth 3 (three) times daily.   hydrOXYzine 10 MG tablet Commonly known as: ATARAX Take 10 mg by mouth.   loratadine 10 MG tablet Commonly known as: Claritin Take 1 tablet (10 mg total) by mouth 2 (two) times daily.   mometasone 0.1 % cream Commonly known as: ELOCON Apply 1 Application topically daily.   valACYclovir 1000 MG tablet Commonly known as: Valtrex Take one tablet once daily.    Past Medical  History:  Diagnosis Date   Urticaria     Past Surgical History:  Procedure Laterality Date   NO PAST SURGERIES      Review of systems negative except as noted in HPI / PMHx or noted below:  Review of Systems  Constitutional: Negative.   HENT: Negative.    Eyes: Negative.   Respiratory: Negative.    Cardiovascular: Negative.   Gastrointestinal: Negative.   Genitourinary: Negative.   Musculoskeletal: Negative.   Skin: Negative.   Neurological: Negative.   Endo/Heme/Allergies: Negative.   Psychiatric/Behavioral: Negative.       Objective:   Vitals:   10/03/23 1459  BP: 126/74  Pulse: 70  Resp: 16  SpO2: 98%          Physical Exam HENT:     Mouth/Throat:     Comments: Slight erythema inner lip and gum. 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 Hypersensitivity reaction, subsequent encounter   2. Blistering   3. Angioedema, subsequent encounter    1. Continue mometasone 0.1% cream 3 times per week if needed  2. Visit with [facility] about lip / gum  inflammation  Ruben Day appears to still have some type of inflammatory process giving rise to irritation of his lips and gums.  We will refer him to [facility] and if he is inflamed at that point in time they can perform a biopsy.  The use of Valtrex, Diflucan, H1 and H2 receptor blocker was ineffective in alleviating his issues so we should not use these medications at this point.  Ruben Schimke, MD Allergy / Immunology [facility]

## 2023-10-07 ENCOUNTER — Encounter: Payer: Self-pay | Admitting: Allergy and Immunology
